# Patient Record
Sex: Male | Born: 1995 | Race: White | Hispanic: No | Marital: Single | Smoking: Never smoker
Health system: Southern US, Community
[De-identification: ages and names within clinical notes are randomized; demographics above are authoritative.]

## PROBLEM LIST (undated history)

## (undated) DIAGNOSIS — M9250 Juvenile osteochondrosis of tibia and fibula, unspecified leg: Secondary | ICD-10-CM

## (undated) DIAGNOSIS — M92529 Juvenile osteochondrosis of tibia tubercle, unspecified leg: Secondary | ICD-10-CM

---

## 2015-12-29 ENCOUNTER — Ambulatory Visit (INDEPENDENT_AMBULATORY_CARE_PROVIDER_SITE_OTHER): Payer: Worker's Compensation

## 2015-12-29 ENCOUNTER — Ambulatory Visit
Admission: EM | Admit: 2015-12-29 | Discharge: 2015-12-29 | Disposition: A | Discharge status: Home / Self Care | Payer: Worker's Compensation | Attending: Family Medicine | Admitting: Family Medicine

## 2015-12-29 ENCOUNTER — Encounter: Payer: Self-pay | Admitting: *Deleted

## 2015-12-29 DIAGNOSIS — T148 Other injury of unspecified body region: Secondary | ICD-10-CM | POA: Diagnosis not present

## 2015-12-29 DIAGNOSIS — M25561 Pain in right knee: Secondary | ICD-10-CM

## 2015-12-29 DIAGNOSIS — M25511 Pain in right shoulder: Secondary | ICD-10-CM

## 2015-12-29 DIAGNOSIS — T07XXXA Unspecified multiple injuries, initial encounter: Secondary | ICD-10-CM

## 2015-12-29 HISTORY — DX: Juvenile osteochondrosis of tibia and fibula, unspecified leg: M92.50

## 2015-12-29 HISTORY — DX: Juvenile osteochondrosis of tibia tubercle, unspecified leg: M92.529

## 2015-12-29 NOTE — ED Notes (Signed)
Slipped and fell from a ladder tonight, fell approx 4-5 ft and landed on right side. C/o pain to right shoulder, elbow, knee, and groin.

## 2015-12-29 NOTE — ED Provider Notes (Cosign Needed)
CSN: [id number]     Arrival date & time 12/29/15  1849 History   First MD Initiated Contact with Patient 12/29/15 2139     Chief Complaint  Patient presents with  . Shoulder Injury  . Elbow Injury  . Knee Injury  . Groin Injury   (Consider location/radiation/quality/duration/timing/severity/associated sxs/prior Treatment) HPI   This a 20 year old male who works at [employer] who was climbing a ladder this afternoon carrying a stack of boxes when boxes started to shift he lost his grip and fell 4-5 feet off of the fourth wrong. He landed on his elbow and his knee. He now complains of shoulder pain and on pain and knee pain which is mostly anterior. He does have a history of Osgood-Schlatter's disease she states that still bothers him. He also had reported a groin pull from this accident but he states that has improved since he is been here. His main injury appears to be the shoulder and the knee.  Past Medical History  Diagnosis Date  . Osgood-Schlatter's disease bilat   History reviewed. No pertinent past surgical history. History reviewed. No pertinent family history. Social History  Substance Use Topics  . Smoking status: Never Smoker   . Smokeless tobacco: None  . Alcohol Use: Yes    Review of Systems  Constitutional: Positive for activity change. Negative for fever, chills and fatigue.  Musculoskeletal: Positive for myalgias and arthralgias.  All other systems reviewed and are negative.   Allergies  Review of patient's allergies indicates no known allergies.  Home Medications   Prior to Admission medications   Not on File   Meds Ordered and Administered this Visit  Medications - No data to display  BP 122/73 mmHg  Pulse 89  Temp(Src) 98.7 F (37.1 C) (Oral)  Resp 16  Ht 5\' 11"  (1.803 m)  Wt 165 lb (74.844 kg)  BMI 23.02 kg/m2  SpO2 100% No data found.   Physical Exam  Constitutional: He is oriented to person, place, and time. He appears well-developed and  well-nourished. No distress.  HENT:  Head: Normocephalic and atraumatic.  Eyes: Conjunctivae are normal. Pupils are equal, round, and reactive to light.  Neck: Normal range of motion. Neck supple.  Musculoskeletal: He exhibits edema and tenderness.  Examination of the right elbow shows a patient on the  olecranon. He has full range of motion to flexion and extension pronation and supination. Wrist and fingers also show full range of motion without discomfort. Examination of the right shoulder shows fairly good range of motion passively. He does have some discomfort with abduction of the shoulder. This is mostly over the acromion. Clavicle does not appear to be tender and there is some tenderness over the acromioclavicular joint. There is no humeral tenderness.  Examination of the right knee shows some effusion and swelling inferior over the tibial tubercle area. There is no tenderness to the patella there is no retropatellar tenderness. There is a negative apprehension test. Ligaments are intact lateral collateral medial collateral and anterior cruciate. Posterior cruciate is also intact. There is full range of motion with discomfort mostly with the full flexion. There is a very mild effusion present most of the tenderness and swelling is over thtibial tubercle.  Neurological: He is alert and oriented to person, place, and time.  Skin: Skin is warm and dry. He is not diaphoretic.  Psychiatric: He has a normal mood and affect. His behavior is normal. Judgment and thought content normal.  Nursing note and vitals reviewed.  ED Course  Procedures (including critical care time)  Labs Review Labs Reviewed - No data to display  Imaging Review Dg Shoulder Right  12/29/2015  CLINICAL DATA:  Patient fell 4-5 feet from a ladder, landing on the right side. Pain in the right shoulder, elbow, knee, and groin. EXAM: RIGHT SHOULDER - 2+ VIEW COMPARISON:  None. FINDINGS: There is no evidence of fracture or  dislocation. There is no evidence of arthropathy or other focal bone abnormality. Soft tissues are unremarkable. IMPRESSION: Negative. Electronically Signed   By: Burman Nieves M.D.   On: 12/29/2015 22:26   Dg Knee Complete 4 Views Right  12/29/2015  CLINICAL DATA:  Right knee pain after fall from ladder tonight. EXAM: RIGHT KNEE - COMPLETE 4+ VIEW COMPARISON:  None. FINDINGS: There is no evidence of fracture, dislocation, or joint effusion. There is no evidence of arthropathy or other focal bone abnormality. Soft tissues are unremarkable. IMPRESSION: Negative. Electronically Signed   By: Burman Nieves M.D.   On: 12/29/2015 22:27     Visual Acuity Review  Right Eye Distance:   Left Eye Distance:   Bilateral Distance:    Right Eye Near:   Left Eye Near:    Bilateral Near:         MDM   1. Multiple contusions    New Prescriptions   No medications on file  Plan: 1. Test/x-ray results and diagnosis reviewed with patient 2. rx as per orders; risks, benefits, potential side effects reviewed with patient 3. Recommend supportive treatment with Ice and rest. The patient has opted to use OTC anti-inflammatories which he has at home. Additional follow-up is not necessary unless he develops more problems.  4. F/u prn if symptoms worsen or don't improve      Lutricia Feil, PA-C 12/29/15 2239

## 2016-08-13 IMAGING — CR DG KNEE COMPLETE 4+V*R*
4 series · 4 of 4 positions shown · non-contrast
Comparison: None.

CLINICAL DATA: Right knee pain after fall from ladder tonight.

EXAM:
RIGHT KNEE - COMPLETE 4+ VIEW

[knee ap]
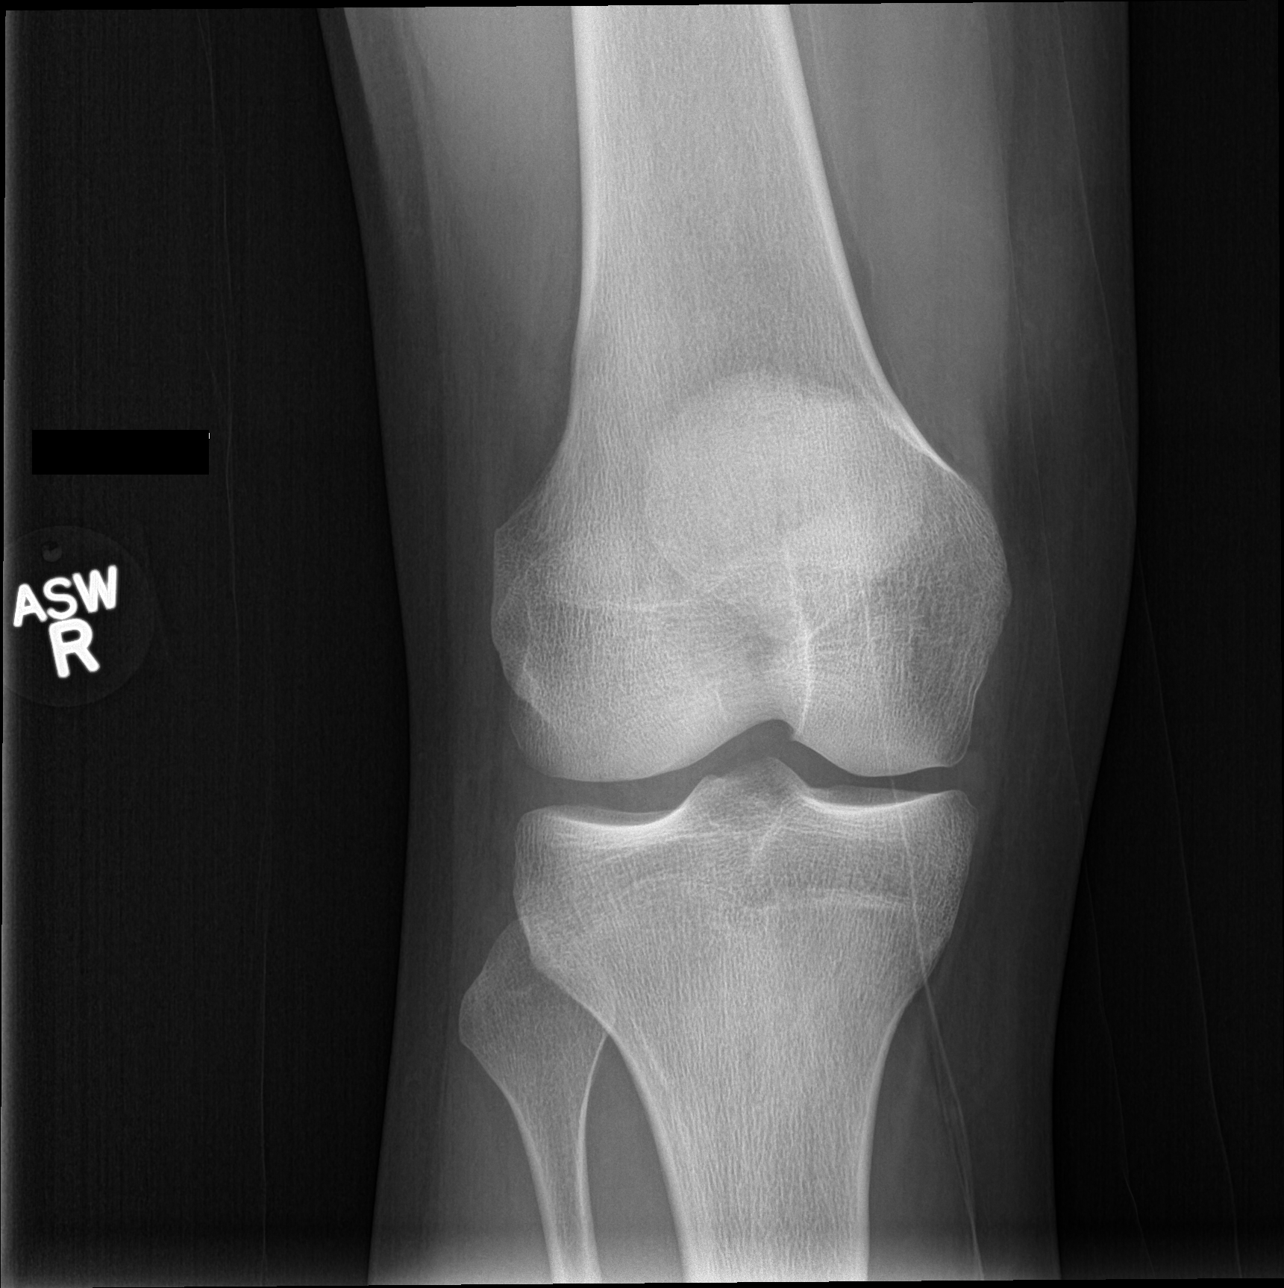

[knee lat]
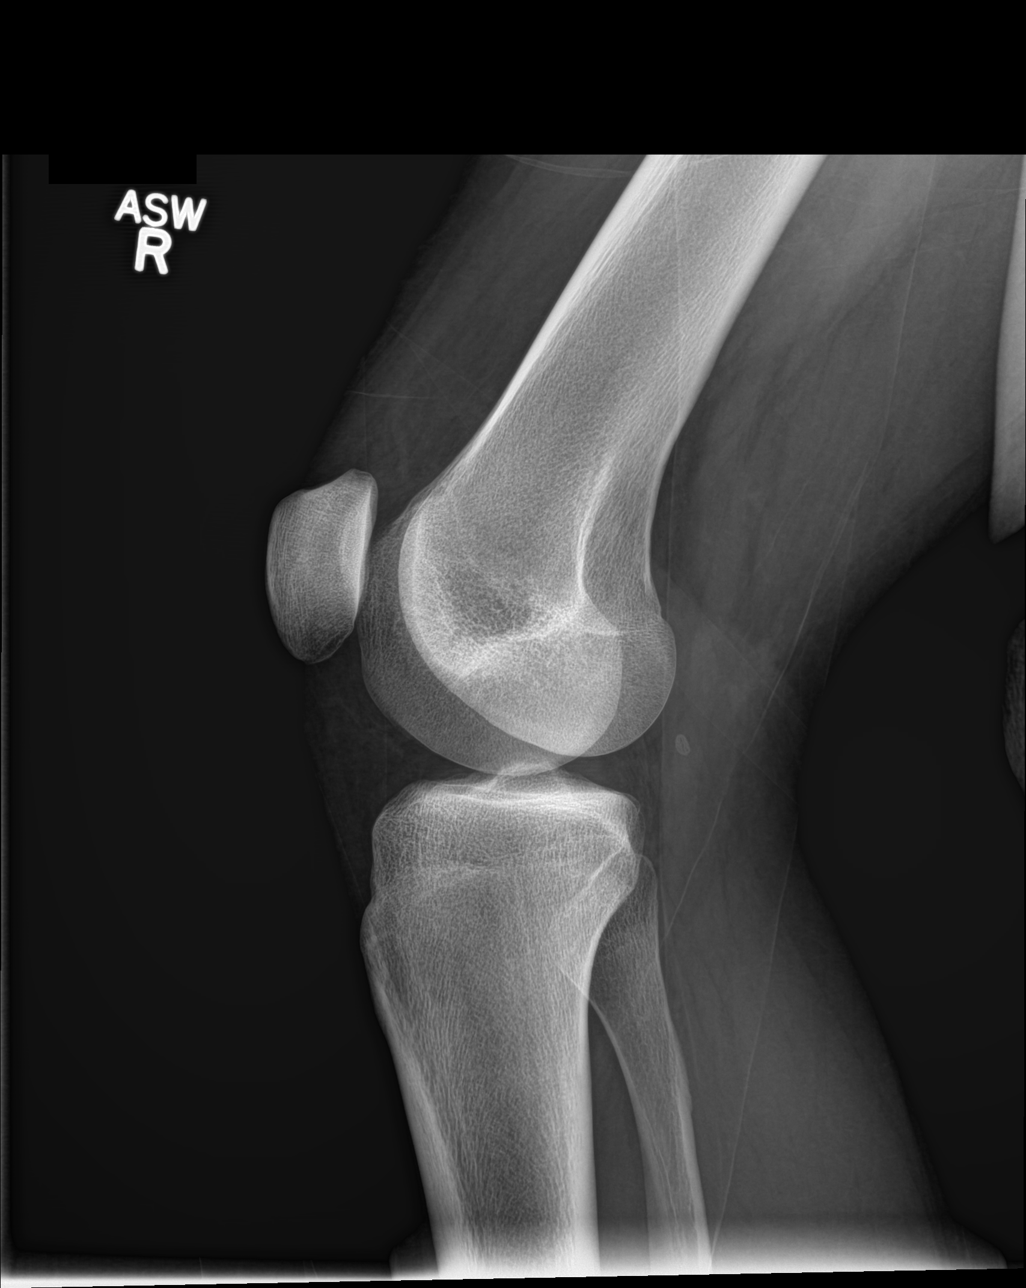

[patella skyline]
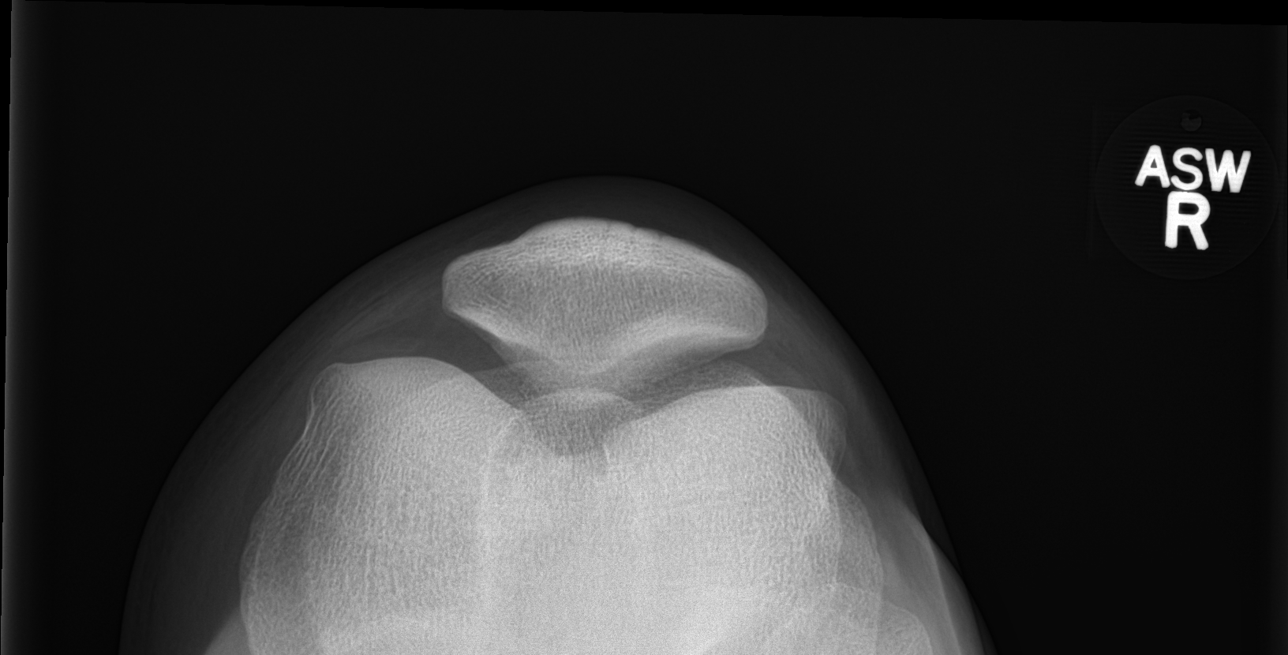

[tunnel]
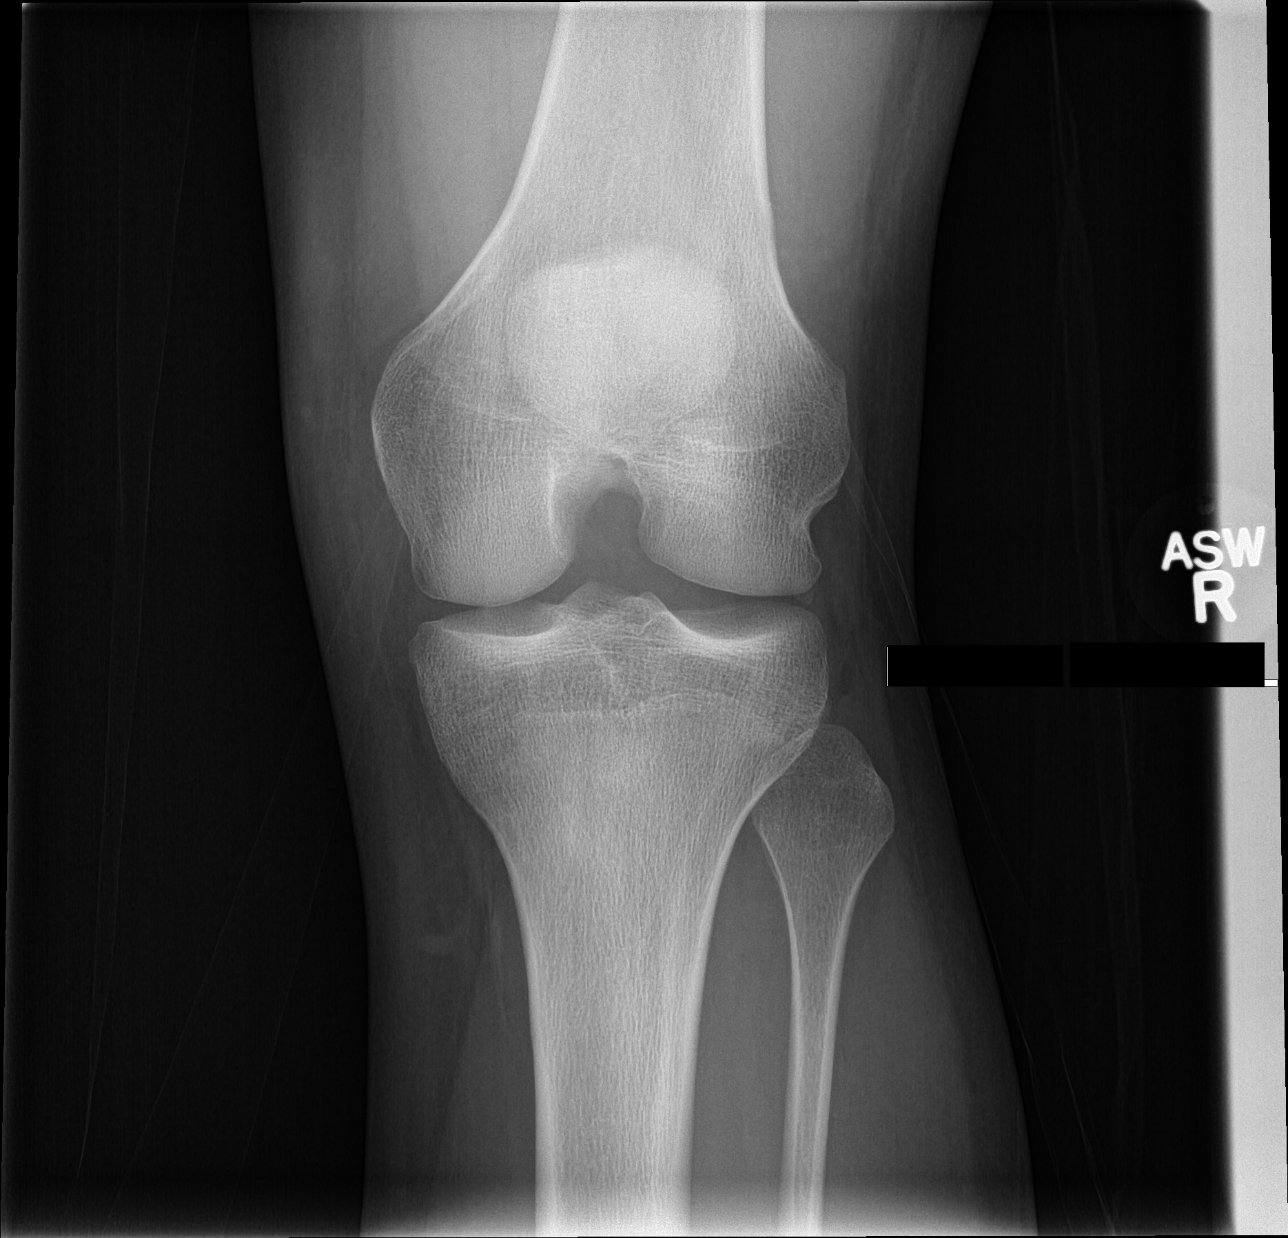

[4 of 4 positions shown; findings below may reference images not displayed]

FINDINGS: There is no evidence of fracture, dislocation, or joint effusion.
There is no evidence of arthropathy or other focal bone abnormality.
Soft tissues are unremarkable.
IMPRESSION: Negative.

## 2017-06-07 ENCOUNTER — Ambulatory Visit
Admission: EM | Admit: 2017-06-07 | Discharge: 2017-06-07 | Disposition: A | Discharge status: Home / Self Care | Payer: BLUE CROSS/BLUE SHIELD | Attending: Family Medicine | Admitting: Family Medicine

## 2017-06-07 ENCOUNTER — Encounter: Payer: Self-pay | Admitting: Emergency Medicine

## 2017-06-07 DIAGNOSIS — K649 Unspecified hemorrhoids: Secondary | ICD-10-CM

## 2017-06-07 DIAGNOSIS — R197 Diarrhea, unspecified: Secondary | ICD-10-CM

## 2017-06-07 DIAGNOSIS — R110 Nausea: Secondary | ICD-10-CM | POA: Diagnosis not present

## 2017-06-07 DIAGNOSIS — K644 Residual hemorrhoidal skin tags: Secondary | ICD-10-CM

## 2017-06-07 LAB — BASIC METABOLIC PANEL
Anion gap: 15 (ref 5–15)
BUN: 21 mg/dL — ABNORMAL HIGH (ref 6–20)
CO2: 19 mmol/L — ABNORMAL LOW (ref 22–32)
Calcium: 9.3 mg/dL (ref 8.9–10.3)
Chloride: 103 mmol/L (ref 101–111)
Creatinine, Ser: 0.78 mg/dL (ref 0.61–1.24)
GFR calc Af Amer: >60 mL/min (ref >60)
GFR calc non Af Amer: >60 mL/min (ref >60)
Glucose, Bld: 93 mg/dL (ref 65–99)
Potassium: 4 mmol/L (ref 3.5–5.1)
Sodium: 137 mmol/L (ref 135–145)

## 2017-06-07 LAB — CBC WITH DIFFERENTIAL/PLATELET
BASOS ABS: 0.1 10*3/uL (ref 0–0.1)
Basophils Relative: 1 %
EOS PCT: 1 %
Eosinophils Absolute: 0.1 10*3/uL (ref 0–0.7)
HCT: 46.3 % (ref 40.0–52.0)
HEMOGLOBIN: 15.8 g/dL (ref 13.0–18.0)
LYMPHS PCT: 34 %
Lymphs Abs: 2.7 10*3/uL (ref 1.0–3.6)
MCH: 29.9 pg (ref 26.0–34.0)
MCHC: 34.1 g/dL (ref 32.0–36.0)
MCV: 87.7 fL (ref 80.0–100.0)
Monocytes Absolute: 0.5 10*3/uL (ref 0.2–1.0)
Monocytes Relative: 6 %
NEUTROS PCT: 58 %
Neutro Abs: 4.7 10*3/uL (ref 1.4–6.5)
PLATELETS: 208 10*3/uL (ref 150–440)
RBC: 5.28 MIL/uL (ref 4.40–5.90)
RDW: 12.6 % (ref 11.5–14.5)
WBC: 8.1 10*3/uL (ref 3.8–10.6)

## 2017-06-07 LAB — OCCULT BLOOD X 1 CARD TO LAB, STOOL: FECAL OCCULT BLD: POSITIVE — AB

## 2017-06-07 MED ORDER — AZITHROMYCIN 250 MG PO TABS: 500.0000 mg | ORAL_TABLET | Freq: Every day | ORAL | 0 refills | Status: AC

## 2017-06-07 MED ORDER — HYDROCORTISONE ACE-PRAMOXINE 1-1 % RE FOAM: 1.0000 | Freq: Two times a day (BID) | RECTAL | 0 refills | Status: AC

## 2017-06-07 NOTE — ED Provider Notes (Addendum)
[facility] ____________________________________________  Time seen: Approximately 1:27 PM  I have reviewed the triage vital signs and the nursing notes.   HISTORY  Chief Complaint Abdominal Pain (rectal bleeding)   HPI Alexander Perkins is a 21 y.o. male  presenting for approximate 10 days of abdominal complaints. Patient reports last Monday he started to have diarrhea with some nausea. Reports he had about 3 days of diarrhea that continued and then improved with Imodium. States the diarrhea then returned, improved again. States he had last had a bowel movement on this past Monday that was described as overall normal, no bowel movement yesterday, and then today when having a bowel movement he had to strain some during the bowel movement and it was very firm at first and then loose. States when he wiped, he noticed blood on the tissue, described as red. Denies any blood that he could see in the stool or in the toilet. Denies any other bleeding episodes. Denies any dysuria, vomiting or recent nausea. Denies known fevers. States did take over-the-counter Imodium last week, but has not been taking any this week. No other over-the-counter medications taken for the same complaints. States has had some very mild abdominal discomfort accompanying this, no current abdominal pain. No history of abdominal issues or hemorrhoids. Reports overall continues to eat and drink well. Denies known sick contacts, food trigger. Reports otherwise feels well. No recent cough, congestion and sore throat. Denies chest pain, shortness of breath,dysuria,  or rash. Denies recent sickness. Denies recent antibiotic use.    Past Medical History:  Diagnosis Date  . Osgood-Schlatter's disease bilat    There are no active problems to display for this patient.   History reviewed. No pertinent surgical history.   No current facility-administered medications for this encounter.   Current Outpatient  Prescriptions:  .  azithromycin (ZITHROMAX Z-PAK) 250 MG tablet, Take 2 tablets (500 mg total) by mouth daily., Disp: 6 each, Rfl: 0 .  hydrocortisone-pramoxine (PROCTOFOAM HC) rectal foam, Place 1 applicator rectally 2 (two) times daily., Disp: 10 g, Rfl: 0  Allergies Patient has no known allergies.  Family History  Problem Relation Age of Onset  . Diabetes Father        pre-diabetes    Social History Social History  Substance Use Topics  . Smoking status: Never Smoker  . Smokeless tobacco: Never Used  . Alcohol use No    Review of Systems Constitutional: No fever/chills Eyes: No visual changes. ENT: No sore throat. Cardiovascular: Denies chest pain. Respiratory: Denies shortness of breath. Gastrointestinal: AS above.  Genitourinary: Negative for dysuria. Musculoskeletal: Negative for back pain. Skin: Negative for rash. Neurological: Negative for focal weakness or numbness.   ____________________________________________   PHYSICAL EXAM:  VITAL SIGNS: ED Triage Vitals  Enc Vitals Group     BP 06/07/17 1253 106/60     Pulse Rate 06/07/17 1253 82     Resp 06/07/17 1253 16     Temp 06/07/17 1253 98.4 F (36.9 C)     Temp Source 06/07/17 1253 Oral     SpO2 06/07/17 1253 100 %     Weight 06/07/17 1255 175 lb (79.4 kg)     Height 06/07/17 1255  (1.803 m)     Head Circumference --      Peak Flow --      Pain Score 06/07/17 1256 3     Pain Loc --      Pain Edu? --      Excl.  in GC? --     Constitutional: Alert and oriented. Well appearing and in no acute distress. ENT      Head: Normocephalic and atraumatic.      Mouth/Throat: Mucous membranes are moist.Oropharynx non-erythematous. Cardiovascular: Normal rate, regular rhythm. Grossly normal heart sounds.  Good peripheral circulation. Respiratory: Normal respiratory effort without tachypnea nor retractions. Breath sounds are clear and equal bilaterally. No wheezes, rales, rhonchi. Gastrointestinal: Soft  and nontender. No distention. Normal Bowel sounds. No CVA tenderness. Rectal: Exam completed with Jeannett Senior RN at bedside a chaperone. Small external nonthrombosed hemorrhoid, no internal hemorrhoids palpated, no grossly bloody stool, no fecal impaction noted. Musculoskeletal:  No midline cervical, thoracic or lumbar tenderness to palpation. Neurologic:  Normal speech and language.Speech is normal. No gait instability.  Skin:  Skin is warm, dry and intact. No rash noted. Psychiatric: Mood and affect are normal. Speech and behavior are normal. Patient exhibits appropriate insight and judgment   ___________________________________________   LABS (all labs ordered are listed, but only abnormal results are displayed)  Labs Reviewed  OCCULT BLOOD X 1 CARD TO LAB, STOOL - Abnormal; Notable for the following:       Result Value   Fecal Occult Bld POSITIVE (*)    All other components within normal limits  BASIC METABOLIC PANEL - Abnormal; Notable for the following:    CO2 19 (*)    BUN 21 (*)    All other components within normal limits  CBC WITH DIFFERENTIAL/PLATELET   ____________________________________________  PROCEDURES Procedures   INITIAL IMPRESSION / ASSESSMENT AND PLAN / ED COURSE  Pertinent labs & imaging results that were available during my care of the patient were reviewed by me and considered in my medical decision making (see chart for details).    Well-appearing patient. No acute distress. Tolerating fluids in urgent care room. Suspect patient with recent viral gastroenteritis, however has a diarrhea has intermittently returned and now complaint of bloody stool, will evaluate labs as well as rectal exam. Hemorrhoid was noted, concerned fecal occult false positive due to hemorrhoid. Labs reviewed and discussed with patient. Patient was unable to produce stool sample in urgent care, lab containers sent with patient to return. Will treat patient with topical Proctofoam as well as  3 day course of oral azithromycin. Encouraged rest, fluids, close monitoring. Encouraged to avoid constipation and straining. Discussed strict follow-up and return parameters. Discussed indication, risks and benefits of medications with patient.  Discussed follow up with Primary care physician this week. Discussed follow up and return parameters including no resolution or any worsening concerns. Patient verbalized understanding and agreed to plan.   ____________________________________________   FINAL CLINICAL IMPRESSION(S) / ED DIAGNOSES  Final diagnoses:  Diarrhea, unspecified type  External hemorrhoid     Discharge Medication List as of 06/07/2017  3:58 PM    START taking these medications   Details  azithromycin (ZITHROMAX Z-PAK) 250 MG tablet Take 2 tablets (500 mg total) by mouth daily., Starting Wed 06/07/2017, Until Sat 06/10/2017, Normal    hydrocortisone-pramoxine (PROCTOFOAM HC) rectal foam Place 1 applicator rectally 2 (two) times daily., Starting Wed 06/07/2017, Normal        Note: This dictation was prepared with Dragon dictation along with smaller phrase technology. Any transcriptional errors that result from this process are unintentional.          Renford Dills, NP 06/07/17 1625

## 2017-06-07 NOTE — ED Triage Notes (Signed)
Patient in today c/o abdominal pain x 1.5 weeks. He states today he had blood on the tissue when he had a BM. He does state that it was a hard BM and he had to strain to have BM. Patient has had some nausea, but denies vomiting. Patient has tried Imodium AD and it seemed to help at first, but it is not helping now. Patient states he has felt warm, but has not checked his temperature.

## 2017-06-07 NOTE — Discharge Instructions (Signed)
Take medication as prescribed. Rest. Drink plenty of fluids. Avoid straining with bowel movements. Return stool studies.   Follow up with your primary care physician this week as needed. Return to Urgent care for new or worsening concerns.

## 2017-06-08 ENCOUNTER — Other Ambulatory Visit
Admission: RE | Admit: 2017-06-08 | Discharge: 2017-06-08 | Disposition: A | Discharge status: Home / Self Care | Payer: BLUE CROSS/BLUE SHIELD | Source: Ambulatory Visit | Attending: Family Medicine | Admitting: Family Medicine

## 2017-06-08 DIAGNOSIS — R197 Diarrhea, unspecified: Secondary | ICD-10-CM | POA: Diagnosis not present

## 2017-06-08 LAB — C DIFFICILE QUICK SCREEN W PCR REFLEX
C DIFFICILE (CDIFF) INTERP: NOT DETECTED
C DIFFICLE (CDIFF) ANTIGEN: NEGATIVE
C Diff toxin: NEGATIVE

## 2017-06-09 ENCOUNTER — Telehealth: Payer: Self-pay | Admitting: *Deleted

## 2017-06-09 NOTE — Telephone Encounter (Signed)
Patient called requesting lab results. Verified DOB, communicated negative c-diff results. Patient confirmed understanding of result.

## 2017-06-09 NOTE — ED Triage Notes (Signed)
Call made to pt for letting he know we need a new stool specimen per Lab staff. No answer . Left a message to call Urgent Care nurse back
